# Patient Record
Sex: Male | Born: 1975 | Race: Black or African American | Hispanic: No | Marital: Single | Smoking: Current every day smoker
Health system: Southern US, Community
[De-identification: ages and names within clinical notes are randomized; demographics above are authoritative.]

## PROBLEM LIST (undated history)

## (undated) DIAGNOSIS — F209 Schizophrenia, unspecified: Secondary | ICD-10-CM

## (undated) DIAGNOSIS — F1010 Alcohol abuse, uncomplicated: Secondary | ICD-10-CM

## (undated) DIAGNOSIS — E119 Type 2 diabetes mellitus without complications: Secondary | ICD-10-CM

---

## 2011-02-13 ENCOUNTER — Emergency Department (HOSPITAL_COMMUNITY)
Admission: EM | Admit: 2011-02-13 | Discharge: 2011-02-13 | Disposition: A | Discharge status: Home / Self Care | Payer: Medicare Other | Attending: Emergency Medicine | Admitting: Emergency Medicine

## 2011-02-13 ENCOUNTER — Emergency Department (HOSPITAL_COMMUNITY): Payer: Medicare Other

## 2011-02-13 DIAGNOSIS — L03317 Cellulitis of buttock: Secondary | ICD-10-CM | POA: Insufficient documentation

## 2011-02-13 DIAGNOSIS — L0231 Cutaneous abscess of buttock: Secondary | ICD-10-CM | POA: Insufficient documentation

## 2011-02-13 DIAGNOSIS — M7989 Other specified soft tissue disorders: Secondary | ICD-10-CM | POA: Insufficient documentation

## 2011-02-13 DIAGNOSIS — M79609 Pain in unspecified limb: Secondary | ICD-10-CM | POA: Insufficient documentation

## 2016-08-28 ENCOUNTER — Encounter: Payer: Self-pay | Admitting: Pediatric Intensive Care

## 2016-09-01 ENCOUNTER — Encounter (HOSPITAL_COMMUNITY): Payer: Self-pay | Admitting: Emergency Medicine

## 2016-09-01 ENCOUNTER — Emergency Department (HOSPITAL_COMMUNITY)
Admission: EM | Admit: 2016-09-01 | Discharge: 2016-09-01 | Disposition: A | Discharge status: Home / Self Care | Payer: Medicare Other | Attending: Emergency Medicine | Admitting: Emergency Medicine

## 2016-09-01 ENCOUNTER — Emergency Department (HOSPITAL_COMMUNITY): Payer: Medicare Other

## 2016-09-01 DIAGNOSIS — Y939 Activity, unspecified: Secondary | ICD-10-CM | POA: Diagnosis not present

## 2016-09-01 DIAGNOSIS — S0003XA Contusion of scalp, initial encounter: Secondary | ICD-10-CM | POA: Insufficient documentation

## 2016-09-01 DIAGNOSIS — Y929 Unspecified place or not applicable: Secondary | ICD-10-CM | POA: Insufficient documentation

## 2016-09-01 DIAGNOSIS — S064X0A Epidural hemorrhage without loss of consciousness, initial encounter: Secondary | ICD-10-CM | POA: Diagnosis not present

## 2016-09-01 DIAGNOSIS — Y999 Unspecified external cause status: Secondary | ICD-10-CM | POA: Insufficient documentation

## 2016-09-01 DIAGNOSIS — S0993XA Unspecified injury of face, initial encounter: Secondary | ICD-10-CM | POA: Diagnosis not present

## 2016-09-01 DIAGNOSIS — W1839XA Other fall on same level, initial encounter: Secondary | ICD-10-CM | POA: Insufficient documentation

## 2016-09-01 DIAGNOSIS — W19XXXA Unspecified fall, initial encounter: Secondary | ICD-10-CM

## 2016-09-01 DIAGNOSIS — F10120 Alcohol abuse with intoxication, uncomplicated: Secondary | ICD-10-CM | POA: Insufficient documentation

## 2016-09-01 DIAGNOSIS — S0990XA Unspecified injury of head, initial encounter: Secondary | ICD-10-CM | POA: Diagnosis not present

## 2016-09-01 DIAGNOSIS — F10920 Alcohol use, unspecified with intoxication, uncomplicated: Secondary | ICD-10-CM

## 2016-09-01 DIAGNOSIS — S199XXA Unspecified injury of neck, initial encounter: Secondary | ICD-10-CM | POA: Diagnosis not present

## 2016-09-01 HISTORY — DX: Alcohol abuse, uncomplicated: F10.10

## 2016-09-01 NOTE — ED Notes (Signed)
Pt rolling around on the floor and asking where his CD player is.  Pt ambulated to bathroom and given a sandwich by MD who saw him at bedside.  Pt keeps putting cigarette in mouth but is not attempting to light it.  Pt found CD player in belongings.

## 2016-09-01 NOTE — ED Triage Notes (Signed)
Per EMS-intoxicated-states fall-hematoma to left eye-patient denies falling-eye glasses shattered at scene -bystander called EMS

## 2016-09-01 NOTE — ED Notes (Signed)
Pt left unit. Pt was asked to come back to allow EDP to medically clear pt. Pt refused. Pt ambulated with steady gait. Pt was swearing at staff as he was walking out the door

## 2016-09-01 NOTE — ED Notes (Signed)
[facility] Expected date:  Expected time:  Means of arrival:  Comments: EMS-ETOH-[employer]

## 2016-09-01 NOTE — ED Notes (Signed)
Pt left the floor against medical advice, and was asked to come back, but pt did not return.

## 2016-09-01 NOTE — ED Provider Notes (Signed)
[facility]-EMERGENCY DEPT Provider Note   CSN: [id number] Arrival date & time: 09/01/16  1748     History   Chief Complaint Chief Complaint  Patient presents with  . Alcohol Intoxication    HPI Chad Ramirez is a 41 y.o. male. 42 year old African-American male with no significant past medical history presents to the ED today with alcohol intoxication. Patient states that he drank a fifth of liquor today. States he is a chronic EtOH abuser. Patient was witnessed by a bystander falling and hitting his head today. He was brought to the ED by EMS. Patient has hematoma to the left eye. Patient shattered his glasses when falling. Patient is difficult to obtain history due to alcohol intoxication. He is able to answer questions appropriately. He denies any pain at this time. He denies any fever, chills, headache, vision changes, lightheadedness, dizziness, chest pain, shortness of breath, abdominal pain, nausea, emesis, urinary symptoms, change in bowel habits, numbness/tingling. HPI  Past Medical History:  Diagnosis Date  . ETOH abuse     There are no active problems to display for this patient.   History reviewed. No pertinent surgical history.     Home Medications    Prior to Admission medications   Not on File    Family History No family history on file.  Social History Social History  Substance Use Topics  . Smoking status: Never Smoker  . Smokeless tobacco: Not on file  . Alcohol use Yes     Allergies   Patient has no allergy information on record.   Review of Systems Review of Systems  Constitutional: Negative for chills and fever.  HENT: Negative for congestion.   Eyes: Negative for pain and visual disturbance.  Respiratory: Negative for cough and shortness of breath.   Cardiovascular: Negative for chest pain.  Gastrointestinal: Negative for abdominal pain, diarrhea, nausea and vomiting.  Musculoskeletal: Negative for back pain and neck stiffness.  Skin:  Negative.   Neurological: Negative for dizziness, syncope, weakness, light-headedness and headaches.  All other systems reviewed and are negative.    Physical Exam Updated Vital Signs There were no vitals taken for this visit.  Physical Exam  Constitutional: He is oriented to person, place, and time. He appears well-developed and well-nourished. No distress.  Patient is walking around and being difficult with staff. He is requesting that we find his glasses and CD player. Patient ambulated to the bathroom with nurse tech with unsteady gait.  HENT:  Head: Normocephalic and atraumatic.    Right Ear: Tympanic membrane, external ear and ear canal normal.  Left Ear: Tympanic membrane, external ear and ear canal normal.  Nose: Nose normal.  Mouth/Throat: Uvula is midline, oropharynx is clear and moist and mucous membranes are normal.  Eyes: Conjunctivae and EOM are normal. Pupils are equal, round, and reactive to light. Right eye exhibits no discharge. Left eye exhibits no discharge. No scleral icterus.  Normal EOM without pain. No subconjunctival hemorrhage noted.  Neck: Normal range of motion. Neck supple. No thyromegaly present.  No midline C-spine tenderness. No deformities or step-offs noted. Full range of motion.  Cardiovascular: Normal rate, regular rhythm, normal heart sounds and intact distal pulses.  Exam reveals no gallop and no friction rub.   No murmur heard. Pulmonary/Chest: Effort normal and breath sounds normal. No respiratory distress.  Abdominal: Soft. Bowel sounds are normal. He exhibits no distension. There is no tenderness. There is no rebound and no guarding.  Musculoskeletal: Normal range of motion.  Moving all  four extremities without any difficulites.  Lymphadenopathy:    He has no cervical adenopathy.  Neurological: He is alert and oriented to person, place, and time. GCS eye subscore is 4. GCS verbal subscore is 5. GCS motor subscore is 6.  The patient is alert,  attentive, and oriented x 3. Speech is clear. Cranial nerve II-VII grossly intact. Negative pronator drift. Sensation intact. Strength 5/5 in all extremities. Reflexes 2+ and symmetric at biceps, triceps, knees, and ankles. Rapid alternating movement and fine finger movements intact. Unsteady gait due to alcohol intoxication.   Skin: Skin is warm and dry. Capillary refill takes less than 2 seconds.  Vitals reviewed.    ED Treatments / Results  Labs (all labs ordered are listed, but only abnormal results are displayed) Labs Reviewed - No data to display  EKG  EKG Interpretation None       Radiology Ct Head Wo Contrast  Result Date: 09/01/2016 CLINICAL DATA:  Fall, intoxicated. EXAM: CT HEAD WITHOUT CONTRAST CT MAXILLOFACIAL WITHOUT CONTRAST CT CERVICAL SPINE WITHOUT CONTRAST TECHNIQUE: Multidetector CT imaging of the head, cervical spine, and maxillofacial structures were performed using the standard protocol without intravenous contrast. Multiplanar CT image reconstructions of the cervical spine and maxillofacial structures were also generated. COMPARISON:  None. FINDINGS: CT HEAD FINDINGS Brain: No intracranial hemorrhage. No parenchymal contusion. No midline shift or mass effect. Basilar cisterns are patent. No skull base fracture. No fluid in the paranasal sinuses or mastoid air cells. Orbits are normal. Vascular: No hyperdense vessel or unexpected calcification. Skull: Normal. Negative for fracture or focal lesion. Sinuses/Orbits: Paranasal sinuses and mastoid air cells are clear. Orbits are clear. Other: Preseptal swelling over the LEFT globe and LEFT frontal bone. Small scalp hematoma. CT MAXILLOFACIAL FINDINGS Osseous: No fracture of the orbits. The zygomatic arches are intact. No maxillary wall fracture. Pterygoid plates are normal. Mandibular condyles are located.  No mandible fracture. Orbits: There is preseptal swelling over the LEFT orbit extending over the LEFT frontal bone. The  globes are intact. No proptosis. Intraconal contents are normal. Sinuses: No fluid in the paranasal sinuses. Frontal sinuses are clear. Soft tissues: Preseptal swelling over the LEFT orbit as above. Dental caries with periapical abscess disease involving the maxilla and mandible (sagittal image 56, series 19 sagittal image 36, series 19). CT CERVICAL SPINE FINDINGS Alignment: Normal Skull base and vertebrae: Craniocervical junction is normal. No loss vertebral body height and disc height. Normal facet articulation. Soft tissues and spinal canal: No epidural paraspinal hematoma. Disc levels:  Mild endplate spurring. Upper chest: Clear Other: None IMPRESSION: 1. No intracranial trauma. 2. Scalp hematoma over the LEFT frontal bone and preseptal swelling. No evidence of LEFT orbital fracture. 3. Dental caries with periapical abscesses. 4. No cervical spine fracture. Electronically Signed   By: Genevive Bi M.D.   On: 09/01/2016 20:43   Ct Cervical Spine Wo Contrast  Result Date: 09/01/2016 CLINICAL DATA:  Fall, intoxicated. EXAM: CT HEAD WITHOUT CONTRAST CT MAXILLOFACIAL WITHOUT CONTRAST CT CERVICAL SPINE WITHOUT CONTRAST TECHNIQUE: Multidetector CT imaging of the head, cervical spine, and maxillofacial structures were performed using the standard protocol without intravenous contrast. Multiplanar CT image reconstructions of the cervical spine and maxillofacial structures were also generated. COMPARISON:  None. FINDINGS: CT HEAD FINDINGS Brain: No intracranial hemorrhage. No parenchymal contusion. No midline shift or mass effect. Basilar cisterns are patent. No skull base fracture. No fluid in the paranasal sinuses or mastoid air cells. Orbits are normal. Vascular: No hyperdense vessel or unexpected calcification.  Skull: Normal. Negative for fracture or focal lesion. Sinuses/Orbits: Paranasal sinuses and mastoid air cells are clear. Orbits are clear. Other: Preseptal swelling over the LEFT globe and LEFT frontal  bone. Small scalp hematoma. CT MAXILLOFACIAL FINDINGS Osseous: No fracture of the orbits. The zygomatic arches are intact. No maxillary wall fracture. Pterygoid plates are normal. Mandibular condyles are located.  No mandible fracture. Orbits: There is preseptal swelling over the LEFT orbit extending over the LEFT frontal bone. The globes are intact. No proptosis. Intraconal contents are normal. Sinuses: No fluid in the paranasal sinuses. Frontal sinuses are clear. Soft tissues: Preseptal swelling over the LEFT orbit as above. Dental caries with periapical abscess disease involving the maxilla and mandible (sagittal image 56, series 19 sagittal image 36, series 19). CT CERVICAL SPINE FINDINGS Alignment: Normal Skull base and vertebrae: Craniocervical junction is normal. No loss vertebral body height and disc height. Normal facet articulation. Soft tissues and spinal canal: No epidural paraspinal hematoma. Disc levels:  Mild endplate spurring. Upper chest: Clear Other: None IMPRESSION: 1. No intracranial trauma. 2. Scalp hematoma over the LEFT frontal bone and preseptal swelling. No evidence of LEFT orbital fracture. 3. Dental caries with periapical abscesses. 4. No cervical spine fracture. Electronically Signed   By: Genevive BiStewart  Edmunds M.D.   On: 09/01/2016 20:43   Ct Maxillofacial Wo Cm  Result Date: 09/01/2016 CLINICAL DATA:  Fall, intoxicated. EXAM: CT HEAD WITHOUT CONTRAST CT MAXILLOFACIAL WITHOUT CONTRAST CT CERVICAL SPINE WITHOUT CONTRAST TECHNIQUE: Multidetector CT imaging of the head, cervical spine, and maxillofacial structures were performed using the standard protocol without intravenous contrast. Multiplanar CT image reconstructions of the cervical spine and maxillofacial structures were also generated. COMPARISON:  None. FINDINGS: CT HEAD FINDINGS Brain: No intracranial hemorrhage. No parenchymal contusion. No midline shift or mass effect. Basilar cisterns are patent. No skull base fracture. No fluid  in the paranasal sinuses or mastoid air cells. Orbits are normal. Vascular: No hyperdense vessel or unexpected calcification. Skull: Normal. Negative for fracture or focal lesion. Sinuses/Orbits: Paranasal sinuses and mastoid air cells are clear. Orbits are clear. Other: Preseptal swelling over the LEFT globe and LEFT frontal bone. Small scalp hematoma. CT MAXILLOFACIAL FINDINGS Osseous: No fracture of the orbits. The zygomatic arches are intact. No maxillary wall fracture. Pterygoid plates are normal. Mandibular condyles are located.  No mandible fracture. Orbits: There is preseptal swelling over the LEFT orbit extending over the LEFT frontal bone. The globes are intact. No proptosis. Intraconal contents are normal. Sinuses: No fluid in the paranasal sinuses. Frontal sinuses are clear. Soft tissues: Preseptal swelling over the LEFT orbit as above. Dental caries with periapical abscess disease involving the maxilla and mandible (sagittal image 56, series 19 sagittal image 36, series 19). CT CERVICAL SPINE FINDINGS Alignment: Normal Skull base and vertebrae: Craniocervical junction is normal. No loss vertebral body height and disc height. Normal facet articulation. Soft tissues and spinal canal: No epidural paraspinal hematoma. Disc levels:  Mild endplate spurring. Upper chest: Clear Other: None IMPRESSION: 1. No intracranial trauma. 2. Scalp hematoma over the LEFT frontal bone and preseptal swelling. No evidence of LEFT orbital fracture. 3. Dental caries with periapical abscesses. 4. No cervical spine fracture. Electronically Signed   By: Genevive BiStewart  Edmunds M.D.   On: 09/01/2016 20:43    Procedures Procedures (including critical care time)  Medications Ordered in ED Medications - No data to display   Initial Impression / Assessment and Plan / ED Course  I have reviewed the triage vital signs and  the nursing notes.  Pertinent labs & imaging results that were available during my care of the patient were  reviewed by me and considered in my medical decision making (see chart for details).  Clinical Course   The patient presents to the ED with alcohol intoxication and fall. Hematoma above the left eye. Patient has no complaints. No focal neuro deficits noted. CAT scan of head, maxillofacial, cervical spine was obtained without any fractures or intracranial bleed. Patient is uncooperative in the ED with staff. He is requesting food. Was given a sandwich and juice. Ice pack applied to eye. No subconjunctival hemorrhage noted. Patient difficult to assess due to intoxication. Patient has unsteady gait. Patient will need to sober up before discharge.Patient left the ED before discharge. [employer] chased patient around the parking lot. States they will take patient to the [facility] take [facility] [employer]. Patient is medically cleared for transport. Discharge papers were given. Encouraged to apply ice to the left forehead. Patient is in no acute distress at this time.  Final Clinical Impressions(s) / ED Diagnoses   Final diagnoses:  Alcoholic intoxication without complication (HCC)  Hematoma of scalp, initial encounter  Fall, initial encounter    New Prescriptions New Prescriptions   No medications on file     Rise Mu, PA-C 09/01/16 2138    Rolan Bucco, MD 09/01/16 [phone]

## 2016-09-01 NOTE — Discharge Instructions (Signed)
Patient is medically cleared. No fracture noted. He needs to sober up. Ice needs to be applied to his left eye.

## 2016-09-01 NOTE — ED Notes (Signed)
Pt continually asks where his glasses are and demands to have them.  Pt has to be reminded that his glasses were broken before coming to the ED.  Pt is alert but needs some orientation.  Pt is independently ambulatory with a steady gait.  Pt often rocks forward and from side to side while sitting in bed and tries to lay on the floor.  Will continue to monitor.

## 2016-10-11 NOTE — Congregational Nurse Program (Signed)
Congregational Nurse Program Note  Date of Encounter: 08/28/2016  Past Medical History: Past Medical History:  Diagnosis Date  . ETOH abuse     Encounter Details: Client states that he is experiencing hand pain. CN recommends establishing PCP for evaluation if continues. Client has been outside in cold. CRT 3 sec, good pulses noted bilaterally.

## 2017-06-14 ENCOUNTER — Encounter (HOSPITAL_COMMUNITY): Payer: Self-pay | Admitting: Cardiology

## 2017-06-14 ENCOUNTER — Observation Stay (HOSPITAL_COMMUNITY): Payer: Medicare Other

## 2017-06-14 ENCOUNTER — Emergency Department (HOSPITAL_COMMUNITY): Payer: Medicare Other

## 2017-06-14 ENCOUNTER — Inpatient Hospital Stay (HOSPITAL_COMMUNITY)
Admission: EM | Admit: 2017-06-14 | Discharge: 2017-06-19 | DRG: 087 | Disposition: A | Discharge status: Home / Self Care | Payer: Medicare Other | Attending: General Surgery | Admitting: General Surgery

## 2017-06-14 DIAGNOSIS — S0280XA Fracture of other specified skull and facial bones, unspecified side, initial encounter for closed fracture: Secondary | ICD-10-CM | POA: Diagnosis not present

## 2017-06-14 DIAGNOSIS — S199XXA Unspecified injury of neck, initial encounter: Secondary | ICD-10-CM | POA: Diagnosis not present

## 2017-06-14 DIAGNOSIS — S020XXA Fracture of vault of skull, initial encounter for closed fracture: Secondary | ICD-10-CM

## 2017-06-14 DIAGNOSIS — F1010 Alcohol abuse, uncomplicated: Secondary | ICD-10-CM | POA: Diagnosis present

## 2017-06-14 DIAGNOSIS — S0232XA Fracture of orbital floor, left side, initial encounter for closed fracture: Secondary | ICD-10-CM | POA: Diagnosis not present

## 2017-06-14 DIAGNOSIS — S0993XA Unspecified injury of face, initial encounter: Secondary | ICD-10-CM | POA: Diagnosis not present

## 2017-06-14 DIAGNOSIS — E1165 Type 2 diabetes mellitus with hyperglycemia: Secondary | ICD-10-CM | POA: Diagnosis present

## 2017-06-14 DIAGNOSIS — S098XXA Other specified injuries of head, initial encounter: Secondary | ICD-10-CM | POA: Diagnosis not present

## 2017-06-14 DIAGNOSIS — S3993XA Unspecified injury of pelvis, initial encounter: Secondary | ICD-10-CM | POA: Diagnosis not present

## 2017-06-14 DIAGNOSIS — I4891 Unspecified atrial fibrillation: Secondary | ICD-10-CM | POA: Diagnosis not present

## 2017-06-14 DIAGNOSIS — H9190 Unspecified hearing loss, unspecified ear: Secondary | ICD-10-CM | POA: Diagnosis present

## 2017-06-14 DIAGNOSIS — S0003XA Contusion of scalp, initial encounter: Secondary | ICD-10-CM | POA: Diagnosis not present

## 2017-06-14 DIAGNOSIS — S3991XA Unspecified injury of abdomen, initial encounter: Secondary | ICD-10-CM | POA: Diagnosis not present

## 2017-06-14 DIAGNOSIS — S0081XA Abrasion of other part of head, initial encounter: Secondary | ICD-10-CM | POA: Diagnosis present

## 2017-06-14 DIAGNOSIS — S0990XA Unspecified injury of head, initial encounter: Secondary | ICD-10-CM | POA: Diagnosis not present

## 2017-06-14 DIAGNOSIS — R402412 Glasgow coma scale score 13-15, at arrival to emergency department: Secondary | ICD-10-CM | POA: Diagnosis not present

## 2017-06-14 DIAGNOSIS — M542 Cervicalgia: Secondary | ICD-10-CM | POA: Diagnosis not present

## 2017-06-14 DIAGNOSIS — R000 Tachycardia, unspecified: Secondary | ICD-10-CM | POA: Diagnosis not present

## 2017-06-14 DIAGNOSIS — Y92410 Unspecified street and highway as the place of occurrence of the external cause: Secondary | ICD-10-CM

## 2017-06-14 DIAGNOSIS — Z23 Encounter for immunization: Secondary | ICD-10-CM

## 2017-06-14 DIAGNOSIS — S299XXA Unspecified injury of thorax, initial encounter: Secondary | ICD-10-CM | POA: Diagnosis not present

## 2017-06-14 DIAGNOSIS — M25512 Pain in left shoulder: Secondary | ICD-10-CM | POA: Diagnosis present

## 2017-06-14 DIAGNOSIS — I10 Essential (primary) hypertension: Secondary | ICD-10-CM | POA: Diagnosis present

## 2017-06-14 DIAGNOSIS — S064X0A Epidural hemorrhage without loss of consciousness, initial encounter: Secondary | ICD-10-CM | POA: Diagnosis not present

## 2017-06-14 DIAGNOSIS — M4802 Spinal stenosis, cervical region: Secondary | ICD-10-CM | POA: Diagnosis present

## 2017-06-14 DIAGNOSIS — F17210 Nicotine dependence, cigarettes, uncomplicated: Secondary | ICD-10-CM | POA: Diagnosis present

## 2017-06-14 DIAGNOSIS — S0240DA Maxillary fracture, left side, initial encounter for closed fracture: Secondary | ICD-10-CM | POA: Diagnosis not present

## 2017-06-14 DIAGNOSIS — S80212A Abrasion, left knee, initial encounter: Secondary | ICD-10-CM | POA: Diagnosis present

## 2017-06-14 DIAGNOSIS — S0219XA Other fracture of base of skull, initial encounter for closed fracture: Secondary | ICD-10-CM | POA: Diagnosis present

## 2017-06-14 DIAGNOSIS — S60512A Abrasion of left hand, initial encounter: Secondary | ICD-10-CM | POA: Diagnosis present

## 2017-06-14 DIAGNOSIS — I480 Paroxysmal atrial fibrillation: Secondary | ICD-10-CM | POA: Diagnosis present

## 2017-06-14 DIAGNOSIS — R202 Paresthesia of skin: Secondary | ICD-10-CM | POA: Diagnosis not present

## 2017-06-14 DIAGNOSIS — S02401A Maxillary fracture, unspecified, initial encounter for closed fracture: Secondary | ICD-10-CM | POA: Diagnosis not present

## 2017-06-14 DIAGNOSIS — M25519 Pain in unspecified shoulder: Secondary | ICD-10-CM

## 2017-06-14 DIAGNOSIS — R40241 Glasgow coma scale score 13-15, unspecified time: Secondary | ICD-10-CM | POA: Diagnosis present

## 2017-06-14 DIAGNOSIS — I700 Atherosclerosis of aorta: Secondary | ICD-10-CM | POA: Diagnosis not present

## 2017-06-14 DIAGNOSIS — Y9301 Activity, walking, marching and hiking: Secondary | ICD-10-CM | POA: Diagnosis present

## 2017-06-14 DIAGNOSIS — R200 Anesthesia of skin: Secondary | ICD-10-CM | POA: Diagnosis present

## 2017-06-14 DIAGNOSIS — Z8659 Personal history of other mental and behavioral disorders: Secondary | ICD-10-CM

## 2017-06-14 DIAGNOSIS — S3992XA Unspecified injury of lower back, initial encounter: Secondary | ICD-10-CM | POA: Diagnosis not present

## 2017-06-14 DIAGNOSIS — S0282XA Fracture of other specified skull and facial bones, left side, initial encounter for closed fracture: Secondary | ICD-10-CM | POA: Diagnosis not present

## 2017-06-14 DIAGNOSIS — R079 Chest pain, unspecified: Secondary | ICD-10-CM | POA: Diagnosis not present

## 2017-06-14 HISTORY — DX: Schizophrenia, unspecified: F20.9

## 2017-06-14 HISTORY — DX: Type 2 diabetes mellitus without complications: E11.9

## 2017-06-14 LAB — CBC
HCT: 46.8 % (ref 39.0–52.0)
Hemoglobin: 15.5 g/dL (ref 13.0–17.0)
MCH: 29.2 pg (ref 26.0–34.0)
MCHC: 33.1 g/dL (ref 30.0–36.0)
MCV: 88.3 fL (ref 78.0–100.0)
PLATELETS: 168 10*3/uL (ref 150–400)
RBC: 5.3 MIL/uL (ref 4.22–5.81)
RDW: 14.1 % (ref 11.5–15.5)
WBC: 4.7 10*3/uL (ref 4.0–10.5)

## 2017-06-14 LAB — COMPREHENSIVE METABOLIC PANEL
ALBUMIN: 3.5 g/dL (ref 3.5–5.0)
ALK PHOS: 79 U/L (ref 38–126)
ALT: 29 U/L (ref 17–63)
ANION GAP: 11 (ref 5–15)
AST: 30 U/L (ref 15–41)
BUN: 9 mg/dL (ref 6–20)
CALCIUM: 9 mg/dL (ref 8.9–10.3)
CHLORIDE: 105 mmol/L (ref 101–111)
CO2: 20 mmol/L — ABNORMAL LOW (ref 22–32)
CREATININE: 1.1 mg/dL (ref 0.61–1.24)
GFR calc Af Amer: >60 mL/min (ref >60)
GFR calc non Af Amer: >60 mL/min (ref >60)
Glucose, Bld: 171 mg/dL — ABNORMAL HIGH (ref 65–99)
POTASSIUM: 3.9 mmol/L (ref 3.5–5.1)
SODIUM: 136 mmol/L (ref 135–145)
TOTAL PROTEIN: 7 g/dL (ref 6.5–8.1)
Total Bilirubin: 0.5 mg/dL (ref 0.3–1.2)

## 2017-06-14 LAB — I-STAT CHEM 8, ED
BUN: 9 mg/dL (ref 6–20)
CALCIUM ION: 1.07 mmol/L — AB (ref 1.15–1.40)
CHLORIDE: 104 mmol/L (ref 101–111)
Creatinine, Ser: 1 mg/dL (ref 0.61–1.24)
Glucose, Bld: 173 mg/dL — ABNORMAL HIGH (ref 65–99)
HEMATOCRIT: 50 % (ref 39.0–52.0)
Hemoglobin: 17 g/dL (ref 13.0–17.0)
Potassium: 3.8 mmol/L (ref 3.5–5.1)
SODIUM: 139 mmol/L (ref 135–145)
TCO2: 22 mmol/L (ref 22–32)

## 2017-06-14 LAB — URINALYSIS, ROUTINE W REFLEX MICROSCOPIC
BILIRUBIN URINE: NEGATIVE
Bacteria, UA: NONE SEEN
GLUCOSE, UA: NEGATIVE mg/dL
Ketones, ur: 5 mg/dL — AB
LEUKOCYTES UA: NEGATIVE
NITRITE: NEGATIVE
PH: 5 (ref 5.0–8.0)
Protein, ur: 30 mg/dL — AB
SPECIFIC GRAVITY, URINE: 1.018 (ref 1.005–1.030)

## 2017-06-14 LAB — PROTIME-INR
INR: 0.92
Prothrombin Time: 12.2 seconds (ref 11.4–15.2)

## 2017-06-14 LAB — ETHANOL

## 2017-06-14 LAB — CDS SEROLOGY

## 2017-06-14 LAB — I-STAT CG4 LACTIC ACID, ED: Lactic Acid, Venous: 1.29 mmol/L (ref 0.5–1.9)

## 2017-06-14 LAB — SAMPLE TO BLOOD BANK

## 2017-06-14 LAB — CBG MONITORING, ED: Glucose-Capillary: 148 mg/dL — ABNORMAL HIGH (ref 65–99)

## 2017-06-14 MED ORDER — SODIUM CHLORIDE 0.45 % IV SOLN
INTRAVENOUS | Status: DC
  Administered 2017-06-14 – 2017-06-17 (×5): via INTRAVENOUS

## 2017-06-14 MED ORDER — CLINDAMYCIN PHOSPHATE 600 MG/50ML IV SOLN
600.0000 mg | Freq: Once | INTRAVENOUS | Status: AC
  Administered 2017-06-14: 600 mg via INTRAVENOUS
  Filled 2017-06-14: qty 50

## 2017-06-14 MED ORDER — ADULT MULTIVITAMIN W/MINERALS CH
1.0000 | ORAL_TABLET | Freq: Every day | ORAL | Status: DC
  Administered 2017-06-14 – 2017-06-19 (×6): 1 via ORAL
  Filled 2017-06-14 (×6): qty 1

## 2017-06-14 MED ORDER — DEXTROSE 5 % IV SOLN
5.0000 mg/h | INTRAVENOUS | Status: DC
  Administered 2017-06-14: 5 mg/h via INTRAVENOUS
  Filled 2017-06-14 (×3): qty 100

## 2017-06-14 MED ORDER — SODIUM CHLORIDE 0.9 % IV BOLUS (SEPSIS)
500.0000 mL | Freq: Once | INTRAVENOUS | Status: AC
  Administered 2017-06-14: 500 mL via INTRAVENOUS

## 2017-06-14 MED ORDER — TETANUS-DIPHTH-ACELL PERTUSSIS 5-2.5-18.5 LF-MCG/0.5 IM SUSP
INTRAMUSCULAR | Status: AC
  Filled 2017-06-14: qty 0.5

## 2017-06-14 MED ORDER — OXYCODONE HCL 5 MG PO TABS
10.0000 mg | ORAL_TABLET | ORAL | Status: DC | PRN
  Administered 2017-06-14 – 2017-06-17 (×6): 10 mg via ORAL
  Filled 2017-06-14 (×5): qty 2

## 2017-06-14 MED ORDER — PNEUMOCOCCAL VAC POLYVALENT 25 MCG/0.5ML IJ INJ
0.5000 mL | INJECTION | INTRAMUSCULAR | Status: AC
  Administered 2017-06-19: 0.5 mL via INTRAMUSCULAR
  Filled 2017-06-14 (×2): qty 0.5

## 2017-06-14 MED ORDER — ONDANSETRON HCL 4 MG/2ML IJ SOLN
4.0000 mg | Freq: Four times a day (QID) | INTRAMUSCULAR | Status: DC | PRN
Start: 2017-06-14 — End: 2017-06-19

## 2017-06-14 MED ORDER — TETANUS-DIPHTH-ACELL PERTUSSIS 5-2.5-18.5 LF-MCG/0.5 IM SUSP
0.5000 mL | Freq: Once | INTRAMUSCULAR | Status: AC
  Administered 2017-06-14: 0.5 mL via INTRAMUSCULAR

## 2017-06-14 MED ORDER — MORPHINE SULFATE (PF) 4 MG/ML IV SOLN
INTRAVENOUS | Status: AC
  Filled 2017-06-14: qty 1

## 2017-06-14 MED ORDER — ONDANSETRON HCL 4 MG/2ML IJ SOLN
INTRAMUSCULAR | Status: AC
  Administered 2017-06-14: 10:00:00
  Filled 2017-06-14: qty 2

## 2017-06-14 MED ORDER — FOLIC ACID 1 MG PO TABS
1.0000 mg | ORAL_TABLET | Freq: Every day | ORAL | Status: DC
  Administered 2017-06-14 – 2017-06-19 (×6): 1 mg via ORAL
  Filled 2017-06-14 (×6): qty 1

## 2017-06-14 MED ORDER — LORAZEPAM 2 MG/ML IJ SOLN: 2.0000 mg | INTRAMUSCULAR | Status: DC | PRN

## 2017-06-14 MED ORDER — DILTIAZEM LOAD VIA INFUSION
10.0000 mg | Freq: Once | INTRAVENOUS | Status: AC
  Administered 2017-06-14: 10 mg via INTRAVENOUS
  Filled 2017-06-14: qty 10

## 2017-06-14 MED ORDER — OXYCODONE HCL 5 MG PO TABS
5.0000 mg | ORAL_TABLET | ORAL | Status: DC | PRN
  Administered 2017-06-15 – 2017-06-18 (×4): 5 mg via ORAL
  Filled 2017-06-14 (×4): qty 1

## 2017-06-14 MED ORDER — ENOXAPARIN SODIUM 40 MG/0.4ML ~~LOC~~ SOLN
40.0000 mg | SUBCUTANEOUS | Status: DC
  Administered 2017-06-15 – 2017-06-18 (×4): 40 mg via SUBCUTANEOUS
  Filled 2017-06-14 (×4): qty 0.4

## 2017-06-14 MED ORDER — ONDANSETRON 4 MG PO TBDP: 4.0000 mg | ORAL_TABLET | Freq: Four times a day (QID) | ORAL | Status: DC | PRN

## 2017-06-14 MED ORDER — SODIUM CHLORIDE 0.9 % IV BOLUS (SEPSIS)
1000.0000 mL | Freq: Once | INTRAVENOUS | Status: AC
  Administered 2017-06-14: 1000 mL via INTRAVENOUS

## 2017-06-14 MED ORDER — IOPAMIDOL (ISOVUE-300) INJECTION 61%
INTRAVENOUS | Status: AC
  Administered 2017-06-14: 100 mL
  Filled 2017-06-14: qty 100

## 2017-06-14 MED ORDER — PANTOPRAZOLE SODIUM 40 MG PO TBEC
40.0000 mg | DELAYED_RELEASE_TABLET | Freq: Every day | ORAL | Status: DC
  Administered 2017-06-16 – 2017-06-19 (×4): 40 mg via ORAL
  Filled 2017-06-14 (×5): qty 1

## 2017-06-14 MED ORDER — VITAMIN B-1 100 MG PO TABS
100.0000 mg | ORAL_TABLET | Freq: Every day | ORAL | Status: DC
  Administered 2017-06-14 – 2017-06-19 (×6): 100 mg via ORAL
  Filled 2017-06-14 (×7): qty 1

## 2017-06-14 MED ORDER — PANTOPRAZOLE SODIUM 40 MG IV SOLR
40.0000 mg | Freq: Every day | INTRAVENOUS | Status: DC
  Administered 2017-06-14 – 2017-06-15 (×2): 40 mg via INTRAVENOUS
  Filled 2017-06-14 (×2): qty 40

## 2017-06-14 MED ORDER — MORPHINE SULFATE (PF) 4 MG/ML IV SOLN
2.0000 mg | Freq: Once | INTRAVENOUS | Status: AC
  Administered 2017-06-14: 2 mg via INTRAVENOUS

## 2017-06-14 MED ORDER — INFLUENZA VAC SPLIT QUAD 0.5 ML IM SUSY
0.5000 mL | PREFILLED_SYRINGE | INTRAMUSCULAR | Status: AC
  Administered 2017-06-19: 0.5 mL via INTRAMUSCULAR
  Filled 2017-06-14: qty 0.5

## 2017-06-14 MED ORDER — DOCUSATE SODIUM 100 MG PO CAPS
100.0000 mg | ORAL_CAPSULE | Freq: Two times a day (BID) | ORAL | Status: DC
  Administered 2017-06-14 – 2017-06-19 (×11): 100 mg via ORAL
  Filled 2017-06-14 (×11): qty 1

## 2017-06-14 MED ORDER — ACETAMINOPHEN 325 MG PO TABS
650.0000 mg | ORAL_TABLET | Freq: Four times a day (QID) | ORAL | Status: DC
  Administered 2017-06-15 – 2017-06-19 (×16): 650 mg via ORAL
  Filled 2017-06-14 (×16): qty 2

## 2017-06-14 MED ORDER — METOPROLOL TARTRATE 25 MG PO TABS
25.0000 mg | ORAL_TABLET | Freq: Two times a day (BID) | ORAL | Status: DC
  Administered 2017-06-14 – 2017-06-19 (×11): 25 mg via ORAL
  Filled 2017-06-14 (×11): qty 1

## 2017-06-14 MED ORDER — BACITRACIN ZINC 500 UNIT/GM EX OINT
TOPICAL_OINTMENT | Freq: Two times a day (BID) | CUTANEOUS | Status: DC
  Administered 2017-06-14: 1 via TOPICAL
  Administered 2017-06-14 – 2017-06-15 (×3): via TOPICAL
  Administered 2017-06-16: 1 via TOPICAL
  Administered 2017-06-16: 22:00:00 via TOPICAL
  Administered 2017-06-17: 1 via TOPICAL
  Administered 2017-06-17: 12:00:00 via TOPICAL
  Administered 2017-06-18: 1 via TOPICAL
  Administered 2017-06-18 – 2017-06-19 (×2): via TOPICAL
  Filled 2017-06-14: qty 28.35
  Filled 2017-06-14: qty 0.9
  Filled 2017-06-14 (×3): qty 28.35

## 2017-06-14 MED ORDER — HYDROMORPHONE HCL 1 MG/ML IJ SOLN
0.5000 mg | INTRAMUSCULAR | Status: DC | PRN
Start: 2017-06-14 — End: 2017-06-17
  Administered 2017-06-14: 0.5 mg via INTRAVENOUS
  Administered 2017-06-15: 1 mg via INTRAVENOUS
  Filled 2017-06-14 (×2): qty 1

## 2017-06-14 NOTE — ED Notes (Signed)
Report given to Bobby RN

## 2017-06-14 NOTE — ED Notes (Signed)
Patient transported to MRI 

## 2017-06-14 NOTE — ED Notes (Signed)
MRI made aware that pt may come over now.

## 2017-06-14 NOTE — ED Notes (Signed)
C collar changed to aspen for patient comfort, MD wyatt aware.

## 2017-06-14 NOTE — Progress Notes (Signed)
Orthopedic Tech Progress Note Patient Details:  Chad Ramirez [birth year] [id number]  Patient ID: Chad Ramirez, male   DOB: [birth year], 41 y.o.   MRN: [id number]   Nikki DomCrawford, Monic Engelmann 06/14/2017, 9:53 AM Made level 2 trauma visit

## 2017-06-14 NOTE — ED Notes (Signed)
Cardiology at bedside.
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
## 2017-06-14 NOTE — ED Notes (Signed)
Assumed care on pt. , pt. resting on bed watching TV with no distress , HR = 79 /min , Aspen collar intact , Cardizem drip infusing at 5 mg/hr . IV sites intact , waiting for MRI and stepdown bed assignment . Denies pain at this time /respirations unlabored .

## 2017-06-14 NOTE — ED Triage Notes (Signed)
Pt just transported back from CT by this RN, MD made aware HR 130's while ion CT BP remains stable. Pt is alert and ox4. Pt gave me his grandmothers phone phone and was able to call for pt and make aware of his presence here.
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
## 2017-06-14 NOTE — ED Notes (Signed)
Attempted to call report

## 2017-06-14 NOTE — ED Notes (Signed)
Pt remains tachycardic, pt denies any serve pain refusing pain medications. Pt received 2L ns receiving 500CC bolus at this time.
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
## 2017-06-14 NOTE — ED Notes (Signed)
[facility] PAGED TO FOLLOW UP WITH CAREDIZEM DRIP

## 2017-06-14 NOTE — ED Notes (Signed)
Chad RighterErin Smith from [facility] states to leave Cardizem on 5mg /hr at this time and may be stopped for pt to be transported to MRI.

## 2017-06-14 NOTE — ED Notes (Signed)
Family at bedside. 
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
## 2017-06-14 NOTE — ED Notes (Addendum)
Pt converted to NRS. md paged for further orders.

## 2017-06-14 NOTE — Care Management Obs Status (Signed)
MEDICARE OBSERVATION STATUS NOTIFICATION   Patient Details  Name: [place] ServiceBrian Leja MRN: [id number] Date of Birth: 24-Aug-1976   Medicare Observation Status Notification Given:  Yes    Lawerance Sabalebbie Senaya Dicenso, RN 06/14/2017, 2:05 PM

## 2017-06-15 ENCOUNTER — Observation Stay (HOSPITAL_BASED_OUTPATIENT_CLINIC_OR_DEPARTMENT_OTHER): Payer: Medicare Other

## 2017-06-15 ENCOUNTER — Encounter (HOSPITAL_COMMUNITY): Payer: Self-pay

## 2017-06-15 DIAGNOSIS — H9190 Unspecified hearing loss, unspecified ear: Secondary | ICD-10-CM | POA: Diagnosis present

## 2017-06-15 DIAGNOSIS — S0219XA Other fracture of base of skull, initial encounter for closed fracture: Secondary | ICD-10-CM | POA: Diagnosis present

## 2017-06-15 DIAGNOSIS — R9431 Abnormal electrocardiogram [ECG] [EKG]: Secondary | ICD-10-CM

## 2017-06-15 DIAGNOSIS — R40241 Glasgow coma scale score 13-15, unspecified time: Secondary | ICD-10-CM | POA: Diagnosis present

## 2017-06-15 DIAGNOSIS — S80212A Abrasion, left knee, initial encounter: Secondary | ICD-10-CM | POA: Diagnosis present

## 2017-06-15 DIAGNOSIS — F17210 Nicotine dependence, cigarettes, uncomplicated: Secondary | ICD-10-CM | POA: Diagnosis present

## 2017-06-15 DIAGNOSIS — Z23 Encounter for immunization: Secondary | ICD-10-CM | POA: Diagnosis not present

## 2017-06-15 DIAGNOSIS — M25519 Pain in unspecified shoulder: Secondary | ICD-10-CM | POA: Diagnosis present

## 2017-06-15 DIAGNOSIS — F1010 Alcohol abuse, uncomplicated: Secondary | ICD-10-CM | POA: Diagnosis present

## 2017-06-15 DIAGNOSIS — Y9301 Activity, walking, marching and hiking: Secondary | ICD-10-CM | POA: Diagnosis not present

## 2017-06-15 DIAGNOSIS — Z8659 Personal history of other mental and behavioral disorders: Secondary | ICD-10-CM | POA: Diagnosis not present

## 2017-06-15 DIAGNOSIS — R202 Paresthesia of skin: Secondary | ICD-10-CM | POA: Diagnosis not present

## 2017-06-15 DIAGNOSIS — R200 Anesthesia of skin: Secondary | ICD-10-CM | POA: Diagnosis present

## 2017-06-15 DIAGNOSIS — I4891 Unspecified atrial fibrillation: Secondary | ICD-10-CM

## 2017-06-15 DIAGNOSIS — M25512 Pain in left shoulder: Secondary | ICD-10-CM | POA: Diagnosis present

## 2017-06-15 DIAGNOSIS — S02401A Maxillary fracture, unspecified, initial encounter for closed fracture: Secondary | ICD-10-CM | POA: Diagnosis not present

## 2017-06-15 DIAGNOSIS — Y92410 Unspecified street and highway as the place of occurrence of the external cause: Secondary | ICD-10-CM | POA: Diagnosis not present

## 2017-06-15 DIAGNOSIS — S020XXA Fracture of vault of skull, initial encounter for closed fracture: Secondary | ICD-10-CM | POA: Diagnosis present

## 2017-06-15 DIAGNOSIS — S0081XA Abrasion of other part of head, initial encounter: Secondary | ICD-10-CM | POA: Diagnosis not present

## 2017-06-15 DIAGNOSIS — S0232XA Fracture of orbital floor, left side, initial encounter for closed fracture: Secondary | ICD-10-CM | POA: Diagnosis not present

## 2017-06-15 DIAGNOSIS — I480 Paroxysmal atrial fibrillation: Secondary | ICD-10-CM | POA: Diagnosis not present

## 2017-06-15 DIAGNOSIS — I10 Essential (primary) hypertension: Secondary | ICD-10-CM | POA: Diagnosis present

## 2017-06-15 DIAGNOSIS — S60512A Abrasion of left hand, initial encounter: Secondary | ICD-10-CM | POA: Diagnosis present

## 2017-06-15 DIAGNOSIS — M4802 Spinal stenosis, cervical region: Secondary | ICD-10-CM | POA: Diagnosis present

## 2017-06-15 DIAGNOSIS — E1165 Type 2 diabetes mellitus with hyperglycemia: Secondary | ICD-10-CM | POA: Diagnosis present

## 2017-06-15 LAB — COMPREHENSIVE METABOLIC PANEL
ALK PHOS: 70 U/L (ref 38–126)
ALT: 23 U/L (ref 17–63)
ANION GAP: 8 (ref 5–15)
AST: 22 U/L (ref 15–41)
Albumin: 3 g/dL — ABNORMAL LOW (ref 3.5–5.0)
BILIRUBIN TOTAL: 0.8 mg/dL (ref 0.3–1.2)
BUN: 9 mg/dL (ref 6–20)
CALCIUM: 8.6 mg/dL — AB (ref 8.9–10.3)
CO2: 23 mmol/L (ref 22–32)
Chloride: 105 mmol/L (ref 101–111)
Creatinine, Ser: 1.12 mg/dL (ref 0.61–1.24)
GFR calc non Af Amer: >60 mL/min (ref >60)
GLUCOSE: 126 mg/dL — AB (ref 65–99)
Potassium: 3.8 mmol/L (ref 3.5–5.1)
Sodium: 136 mmol/L (ref 135–145)
TOTAL PROTEIN: 6.1 g/dL — AB (ref 6.5–8.1)

## 2017-06-15 LAB — HEMOGLOBIN A1C
Hgb A1c MFr Bld: 6.7 % — ABNORMAL HIGH (ref 4.8–5.6)
MEAN PLASMA GLUCOSE: 145.59 mg/dL

## 2017-06-15 LAB — MRSA PCR SCREENING: MRSA by PCR: NEGATIVE

## 2017-06-15 LAB — CBC
HEMATOCRIT: 43.2 % (ref 39.0–52.0)
HEMOGLOBIN: 14.3 g/dL (ref 13.0–17.0)
MCH: 29.6 pg (ref 26.0–34.0)
MCHC: 33.1 g/dL (ref 30.0–36.0)
MCV: 89.4 fL (ref 78.0–100.0)
Platelets: 175 10*3/uL (ref 150–400)
RBC: 4.83 MIL/uL (ref 4.22–5.81)
RDW: 14.2 % (ref 11.5–15.5)
WBC: 9 10*3/uL (ref 4.0–10.5)

## 2017-06-15 LAB — TSH: TSH: 0.625 u[IU]/mL (ref 0.350–4.500)

## 2017-06-15 LAB — LIPID PANEL
CHOLESTEROL: 190 mg/dL (ref 0–200)
HDL: 82 mg/dL (ref >40)
LDL Cholesterol: 91 mg/dL (ref 0–99)
Total CHOL/HDL Ratio: 2.3 RATIO
Triglycerides: 86 mg/dL (ref <150)
VLDL: 17 mg/dL (ref 0–40)

## 2017-06-15 LAB — ECHOCARDIOGRAM COMPLETE
HEIGHTINCHES: 76 in
Weight: 3530.89 oz

## 2017-06-15 LAB — HIV ANTIBODY (ROUTINE TESTING W REFLEX): HIV Screen 4th Generation wRfx: NONREACTIVE
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
## 2017-06-15 NOTE — Progress Notes (Signed)
  Echocardiogram 2D Echocardiogram has been performed.  Chad Ramirez 06/15/2017, 11:21 AM
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
## 2017-06-15 NOTE — Progress Notes (Signed)
Gave report to receiving RN on 6N. Pt to be transferred via bed

## 2017-06-16 DIAGNOSIS — I480 Paroxysmal atrial fibrillation: Secondary | ICD-10-CM
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
## 2017-06-17 LAB — BASIC METABOLIC PANEL
Anion gap: 9 (ref 5–15)
BUN: 9 mg/dL (ref 6–20)
CO2: 23 mmol/L (ref 22–32)
Calcium: 8.8 mg/dL — ABNORMAL LOW (ref 8.9–10.3)
Chloride: 102 mmol/L (ref 101–111)
Creatinine, Ser: 1.07 mg/dL (ref 0.61–1.24)
GFR calc Af Amer: >60 mL/min (ref >60)
GFR calc non Af Amer: >60 mL/min (ref >60)
Glucose, Bld: 126 mg/dL — ABNORMAL HIGH (ref 65–99)
Potassium: 3.9 mmol/L (ref 3.5–5.1)
Sodium: 134 mmol/L — ABNORMAL LOW (ref 135–145)

## 2017-06-17 LAB — CBC
HEMATOCRIT: 39.9 % (ref 39.0–52.0)
HEMOGLOBIN: 13.3 g/dL (ref 13.0–17.0)
MCH: 29.4 pg (ref 26.0–34.0)
MCHC: 33.3 g/dL (ref 30.0–36.0)
MCV: 88.3 fL (ref 78.0–100.0)
Platelets: 189 10*3/uL (ref 150–400)
RBC: 4.52 MIL/uL (ref 4.22–5.81)
RDW: 13.7 % (ref 11.5–15.5)
WBC: 5.4 10*3/uL (ref 4.0–10.5)

## 2017-06-17 MED ORDER — HYDROCHLOROTHIAZIDE 12.5 MG PO CAPS
12.5000 mg | ORAL_CAPSULE | Freq: Every day | ORAL | Status: DC
  Administered 2017-06-17 – 2017-06-19 (×3): 12.5 mg via ORAL
  Filled 2017-06-17 (×3): qty 1

## 2017-06-17 MED ORDER — LIVING WELL WITH DIABETES BOOK
Freq: Once | Status: AC
  Administered 2017-06-17: 20:00:00
  Filled 2017-06-17 (×2): qty 1

## 2017-06-17 MED ORDER — HYDROMORPHONE HCL 1 MG/ML IJ SOLN: 0.5000 mg | INTRAMUSCULAR | Status: DC | PRN
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
## 2017-06-17 NOTE — Progress Notes (Signed)
Reviewed the Diabetes education access material with the patient.  He advised that he already knew about diabetes educations, but would look over the education.  I advised him that the physician wanted him to review the material and circled the educational material that were requested for review.  He said that he already knew it, and may look at it.
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
## 2017-06-17 NOTE — Progress Notes (Signed)
OT Note - Addendum    06/17/17 1300  OT Visit Information  Last OT Received On 06/17/17  OT Time Calculation  OT Start Time (ACUTE ONLY) 1225  OT Stop Time (ACUTE ONLY) 1240  OT Time Calculation (min) 15 min  OT General Charges  $OT Visit 1 Visit  OT Evaluation  $OT Eval Low Complexity 1 Low  [facility] Jaz Mallick, OT/L  [phone] 06/17/2017
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
## 2017-06-17 NOTE — Progress Notes (Signed)
Inpatient Diabetes Program Recommendations  AACE/ADA: New Consensus Statement on Inpatient Glycemic Control (2015)  Target Ranges:  Prepandial:   less than 140 mg/dL      Peak postprandial:   less than 180 mg/dL (1-2 hours)      Critically ill patients:  140 - 180 mg/dL   Lab Results  Component Value Date   GLUCAP 148 (H) 06/14/2017   HGBA1C 6.7 (H) 06/15/2017    Review of Glycemic Control  Diabetes history: None Outpatient Diabetes medications: N/A Current orders for Inpatient glycemic control: None  HgbA1C of 6.7% indicates diagnosis of DM.   Inpatient Diabetes Program Recommendations:    Good diet and exercise for 1st line therapy for DM. Would not recommend metformin with possible ETOH use If OHA were to be added, consider Januvia 50 mg QD.  Ordered Agricultural engineereducational material on diabetes. Needs PCP to manage his new onset DM.  Will follow. Thank you. Ailene Ardshonda March Joos, RD, LDN, CDE Inpatient Diabetes Coordinator [phone]

## 2017-06-18 MED ORDER — METOPROLOL TARTRATE 25 MG PO TABS: 25.0000 mg | ORAL_TABLET | Freq: Two times a day (BID) | ORAL | 0 refills | Status: DC

## 2017-06-18 MED ORDER — BACITRACIN ZINC 500 UNIT/GM EX OINT: TOPICAL_OINTMENT | Freq: Two times a day (BID) | CUTANEOUS | 0 refills | Status: AC

## 2017-06-18 MED ORDER — HYDROCHLOROTHIAZIDE 12.5 MG PO CAPS: 12.5000 mg | ORAL_CAPSULE | Freq: Every day | ORAL | 0 refills | Status: DC

## 2017-06-18 MED ORDER — OXYCODONE HCL 5 MG PO TABS: 5.0000 mg | ORAL_TABLET | Freq: Four times a day (QID) | ORAL | 0 refills | Status: AC | PRN

## 2017-06-18 NOTE — Discharge Instructions (Signed)
Soft-Food Meal Plan °A soft-food meal plan includes foods that are safe and easy to swallow. This meal plan typically is used: °· If you are having trouble chewing or swallowing foods. °· As a transition meal plan after only having had liquid meals for a long period. ° °What do I need to know about the soft-food meal plan? °A soft-food meal plan includes tender foods that are soft and easy to chew and swallow. In most cases, bite-sized pieces of food are easier to swallow. A bite-sized piece is about ½ inch or smaller. Foods in this plan do not need to be ground or pureed. °Foods that are very hard, crunchy, or sticky should be avoided. Also, breads, cereals, yogurts, and desserts with nuts, seeds, or fruits should be avoided. °What foods can I eat? °Grains °Rice and wild rice. Moist bread, dressing, pasta, and noodles. Well-moistened dry or cooked cereals, such as farina (cooked wheat cereal), oatmeal, or grits. Biscuits, breads, muffins, pancakes, and waffles that have been well moistened. °Vegetables °Shredded lettuce. Cooked, tender vegetables, including potatoes without skins. Vegetable juices. Broths or creamed soups made with vegetables that are not stringy or chewy. Strained tomatoes (without seeds). °Fruits °Canned or well-cooked fruits. Soft (ripe), peeled fresh fruits, such as peaches, nectarines, kiwi, cantaloupe, honeydew melon, and watermelon (without seeds). Soft berries with small seeds, such as strawberries. Fruit juices (without pulp). °Meats and Other Protein Sources °Moist, tender, lean beef. Mutton. Lamb. Veal. Chicken. Turkey. Liver. Ham. Fish without bones. Eggs. °Dairy °Milk, milk drinks, and cream. Plain cream cheese and cottage cheese. Plain yogurt. °Sweets/Desserts °Flavored gelatin desserts. Custard. Plain ice cream, frozen yogurt, sherbet, milk shakes, and malts. Plain cakes and cookies. Plain hard candy. °Other °Butter, margarine (without trans fat), and cooking oils. Mayonnaise. Cream  sauces. Mild spices, salt, and sugar. Syrup, molasses, honey, and jelly. °The items listed above may not be a complete list of recommended foods or beverages. Contact your dietitian for more options. °What foods are not recommended? °Grains °Dry bread, toast, crackers that have not been moistened. Coarse or dry cereals, such as bran, granola, and shredded wheat. Tough or chewy crusty breads, such as French bread or baguettes. °Vegetables °Corn. Raw vegetables except shredded lettuce. Cooked vegetables that are tough or stringy. Tough, crisp, fried potatoes and potato skins. °Fruits °Fresh fruits with skins or seeds or both, such as apples, pears, or grapes. Stringy, high-pulp fruits, such as papaya, pineapple, coconut, or mango. Fruit leather, fruit roll-ups, and all dried fruits. °Meats and Other Protein Sources °Sausages and hot dogs. Meats with gristle. Fish with bones. Nuts, seeds, and chunky peanut or other nut butters. °Sweets/Desserts °Cakes or cookies that are very dry or chewy. °The items listed above may not be a complete list of foods and beverages to avoid. Contact your dietitian for more information. °This information is not intended to replace advice given to you by your health care provider. Make sure you discuss any questions you have with your health care provider. °Document Released: 11/20/2007 Document Revised: 01/19/2016 Document Reviewed: 07/10/2013 °Elsevier Interactive Patient Education © 2017 Elsevier Inc. ° °

## 2017-06-18 NOTE — Progress Notes (Signed)
Inpatient Diabetes Program Recommendations  AACE/ADA: New Consensus Statement on Inpatient Glycemic Control (2015)  Target Ranges:  Prepandial:   less than 140 mg/dL      Peak postprandial:   less than 180 mg/dL (1-2 hours)      Critically ill patients:  140 - 180 mg/dL   Lab Results  Component Value Date   GLUCAP 148 (H) 06/14/2017   HGBA1C 6.7 (H) 06/15/2017    Review of Glycemic Control  Spoke with pt about his diagnosis of diabetes. Discussed importance of good control to prevent long-term complications. Pt states he has been told in the past that he needed to "watch his sugar." States he was still drinking sodas and eating sweets. [facility]  Discussed HgbA1C of 6.7% and goal of < 6.5%. Discussed how diet, exercise and medications have affect on blood sugar control. Answered questions.   Thank you. Ailene Ardshonda Teyonna Plaisted, RD, LDN, CDE Inpatient Diabetes Coordinator [phone]

## 2017-06-18 NOTE — Progress Notes (Addendum)
Patient's brother have not called yet. Patient waiting for his brother to pick him up on discharge since pt. will be staying with his brother.  On call SW made aware of patient's current situation thru text page.  Patient now resting on bed comfortably still waiting for his brother. Likewise paged on-call Trauma MD for info.
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
## 2017-06-18 NOTE — Progress Notes (Signed)
Attempted to call ED SW. Unable to reach anyone due to the mailbox is full. Pt is requesting need for transportation to brothers house in [place]
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
## 2017-06-18 NOTE — Progress Notes (Signed)
2nd attempt to get in touch with SW  In ED. Mailbox remains full, no answer( [phone]). I called ED in hopes to be able to get a pager number. I was told the only way to reach SW was amion.  Pt has still not heard from brother. Pt states that he has no where to go in [place]..Marland Kitchen
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
## 2017-06-19 NOTE — Care Management Important Message (Signed)
Important Message  Patient Details  Name: Chad Ramirez MRN: [id number] Date of Birth: [birth year]   Medicare Important Message Given:  Yes    Sadhana Frater 06/19/2017, 11:55 AM

## 2017-06-20 ENCOUNTER — Encounter (HOSPITAL_COMMUNITY): Payer: Self-pay | Admitting: Emergency Medicine

## 2017-06-24 ENCOUNTER — Emergency Department (HOSPITAL_BASED_OUTPATIENT_CLINIC_OR_DEPARTMENT_OTHER)
Admission: EM | Admit: 2017-06-24 | Discharge: 2017-06-24 | Disposition: A | Discharge status: Home / Self Care | Payer: Medicare Other | Attending: Emergency Medicine | Admitting: Emergency Medicine

## 2017-06-24 ENCOUNTER — Encounter (HOSPITAL_BASED_OUTPATIENT_CLINIC_OR_DEPARTMENT_OTHER): Payer: Self-pay | Admitting: Emergency Medicine

## 2017-06-24 DIAGNOSIS — Z5321 Procedure and treatment not carried out due to patient leaving prior to being seen by health care provider: Secondary | ICD-10-CM | POA: Insufficient documentation

## 2017-06-24 DIAGNOSIS — M25519 Pain in unspecified shoulder: Secondary | ICD-10-CM | POA: Diagnosis not present
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
## 2017-06-24 NOTE — ED Notes (Signed)
Family at bedside. 
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
## 2017-07-05 ENCOUNTER — Ambulatory Visit (INDEPENDENT_AMBULATORY_CARE_PROVIDER_SITE_OTHER): Payer: Medicare Other | Admitting: Family Medicine

## 2017-07-05 ENCOUNTER — Encounter: Payer: Self-pay | Admitting: Family Medicine

## 2017-07-05 VITALS — BP 136/88 | HR 78 | Temp 97.8°F | Resp 16 | Ht 76.0 in | Wt 222.8 lb

## 2017-07-05 DIAGNOSIS — M25562 Pain in left knee: Secondary | ICD-10-CM

## 2017-07-05 DIAGNOSIS — M25512 Pain in left shoulder: Secondary | ICD-10-CM | POA: Diagnosis not present

## 2017-07-05 DIAGNOSIS — I4891 Unspecified atrial fibrillation: Secondary | ICD-10-CM

## 2017-07-05 DIAGNOSIS — I10 Essential (primary) hypertension: Secondary | ICD-10-CM | POA: Diagnosis not present

## 2017-07-05 DIAGNOSIS — E119 Type 2 diabetes mellitus without complications: Secondary | ICD-10-CM

## 2017-07-05 MED ORDER — METFORMIN HCL 500 MG PO TABS: 500.0000 mg | ORAL_TABLET | Freq: Every day | ORAL | 3 refills | Status: AC

## 2017-07-05 MED ORDER — METOPROLOL TARTRATE 25 MG PO TABS: 25.0000 mg | ORAL_TABLET | Freq: Two times a day (BID) | ORAL | 3 refills | Status: DC

## 2017-07-05 MED ORDER — HYDROCHLOROTHIAZIDE 12.5 MG PO CAPS: 12.5000 mg | ORAL_CAPSULE | Freq: Every day | ORAL | 3 refills | Status: AC

## 2017-07-05 MED ORDER — METOPROLOL TARTRATE 25 MG PO TABS: 25.0000 mg | ORAL_TABLET | Freq: Two times a day (BID) | ORAL | 3 refills | Status: AC
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
## 2017-07-05 NOTE — Patient Instructions (Addendum)
Expect calls regarding the following appointments:  Glenna Fellowshimmappa, Brinda, MD.  Specialty:  Plastic Surgery Why:  call to make a follow up regarding your facial fractures Contact information: [address] [place] [address] [phone]  Cardiology regarding heart rhythm and orthopedics regarding left shoulder, arm, and knee pain.

## 2017-07-08 ENCOUNTER — Telehealth: Payer: Self-pay
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
## 2017-07-08 NOTE — Telephone Encounter (Signed)
Patient has a appointment scheduled for 07/15/2017

## 2017-07-22 ENCOUNTER — Encounter (INDEPENDENT_AMBULATORY_CARE_PROVIDER_SITE_OTHER): Payer: Self-pay | Admitting: Orthopaedic Surgery

## 2017-07-22 ENCOUNTER — Ambulatory Visit (INDEPENDENT_AMBULATORY_CARE_PROVIDER_SITE_OTHER): Payer: Medicare Other | Admitting: Orthopaedic Surgery

## 2017-07-22 DIAGNOSIS — M25512 Pain in left shoulder: Secondary | ICD-10-CM | POA: Diagnosis not present

## 2017-07-22 DIAGNOSIS — M25511 Pain in right shoulder: Secondary | ICD-10-CM

## 2017-07-22 DIAGNOSIS — G8929 Other chronic pain: Secondary | ICD-10-CM | POA: Diagnosis not present

## 2017-07-22 MED ORDER — TIZANIDINE HCL 4 MG PO TABS: 4.0000 mg | ORAL_TABLET | Freq: Four times a day (QID) | ORAL | 2 refills | Status: AC | PRN

## 2017-07-22 MED ORDER — NAPROXEN 500 MG PO TABS: 500.0000 mg | ORAL_TABLET | Freq: Two times a day (BID) | ORAL | 3 refills | Status: AC
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
## 2017-12-26 DIAGNOSIS — H16223 Keratoconjunctivitis sicca, not specified as Sjogren's, bilateral: Secondary | ICD-10-CM | POA: Diagnosis not present
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
## 2019-10-23 DIAGNOSIS — Z20828 Contact with and (suspected) exposure to other viral communicable diseases: Secondary | ICD-10-CM | POA: Diagnosis not present

## 2024-04-27 DEATH — deceased
